# Patient Record
Sex: Female | Born: 1935 | Race: White | Hispanic: No
Health system: Southern US, Community
[De-identification: ages and names within clinical notes are randomized; demographics above are authoritative.]

---

## 2003-11-03 ENCOUNTER — Ambulatory Visit (HOSPITAL_COMMUNITY): Admission: RE | Admit: 2003-11-03 | Discharge: 2003-11-03 | Payer: Self-pay | Admitting: Neurology

## 2005-05-01 ENCOUNTER — Ambulatory Visit: Payer: Self-pay | Admitting: Vascular Surgery

## 2005-05-11 ENCOUNTER — Other Ambulatory Visit: Payer: Self-pay

## 2005-05-11 ENCOUNTER — Ambulatory Visit: Payer: Self-pay | Admitting: Vascular Surgery

## 2005-05-23 ENCOUNTER — Inpatient Hospital Stay: Payer: Self-pay | Admitting: Internal Medicine

## 2005-05-23 ENCOUNTER — Other Ambulatory Visit: Payer: Self-pay

## 2005-06-28 ENCOUNTER — Encounter: Payer: Self-pay | Admitting: Internal Medicine

## 2005-06-29 ENCOUNTER — Ambulatory Visit: Payer: Self-pay | Admitting: Vascular Surgery

## 2007-01-07 ENCOUNTER — Ambulatory Visit: Payer: Self-pay | Admitting: Surgery

## 2007-01-25 ENCOUNTER — Ambulatory Visit: Payer: Self-pay | Admitting: Unknown Physician Specialty

## 2007-07-25 ENCOUNTER — Ambulatory Visit: Payer: Self-pay | Admitting: Unknown Physician Specialty

## 2008-04-01 ENCOUNTER — Ambulatory Visit: Payer: Self-pay | Admitting: Internal Medicine

## 2008-04-02 ENCOUNTER — Ambulatory Visit: Payer: Self-pay | Admitting: Internal Medicine

## 2008-04-06 ENCOUNTER — Inpatient Hospital Stay: Payer: Self-pay | Admitting: Internal Medicine

## 2008-04-06 ENCOUNTER — Other Ambulatory Visit: Payer: Self-pay

## 2008-04-09 ENCOUNTER — Ambulatory Visit: Payer: Self-pay | Admitting: Internal Medicine

## 2008-05-03 ENCOUNTER — Ambulatory Visit: Payer: Self-pay | Admitting: Radiation Oncology

## 2008-05-03 ENCOUNTER — Ambulatory Visit: Payer: Self-pay | Admitting: Internal Medicine

## 2008-05-06 ENCOUNTER — Inpatient Hospital Stay: Payer: Self-pay | Admitting: Internal Medicine

## 2008-06-02 ENCOUNTER — Ambulatory Visit: Payer: Self-pay | Admitting: Radiation Oncology

## 2008-06-02 ENCOUNTER — Ambulatory Visit: Payer: Self-pay | Admitting: Internal Medicine

## 2009-06-18 IMAGING — CR DG CHEST 1V PORT
1 series · 1 of 1 positions shown · non-contrast
Comparison: none

REASON FOR EXAM: Check position of central venous catheter
COMMENTS:

PROCEDURE:     DXR - DXR PORTABLE CHEST SINGLE VIEW  - April 15, 2008  [DATE]
RESULT:     A Port-A-Cath is noted with its tip in the superior vena cava.
Atelectasis is noted in the RIGHT lower lobe. This is a new finding from
04-06-08.  The LEFT lung is clear.

[view not recorded]
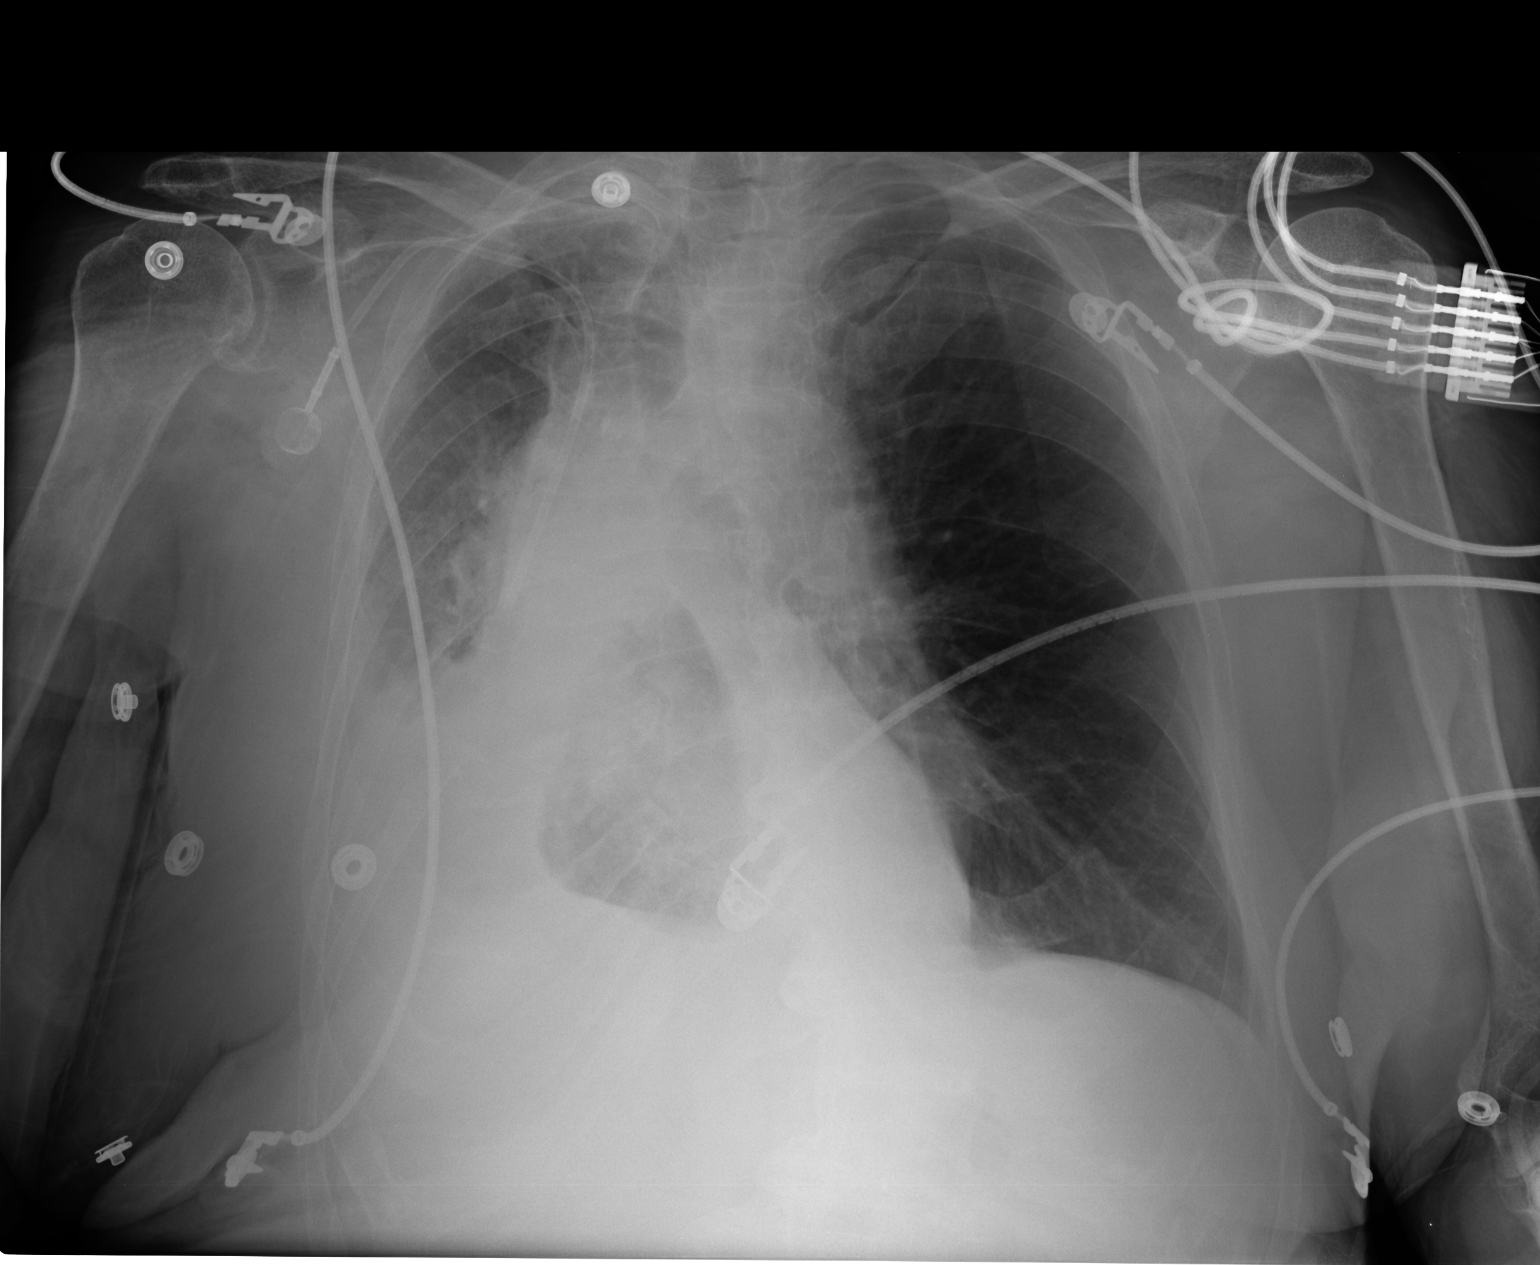

[1 of 1 positions shown; findings below may reference images not displayed]

IMPRESSION: 1. New onset of atelectasis in the RIGHT lower lobe and possible RIGHT
middle lobe. Shift of the midline from LEFT to RIGHT is present.

2. Port-A-Cath noted in good anatomic position. Close follow-up chest x-ray
is recommended to demonstrate clearing of the dense atelectasis on the
RIGHT.

## 2009-07-09 IMAGING — CR DG CHEST 2V
1 series · 2 of 2 positions shown · non-contrast
Comparison: none

REASON FOR EXAM: dyspnea
COMMENTS:

[Series 1: view not recorded · 0.17mm/px · 2 of 2 slices shown]
[im 1/2]
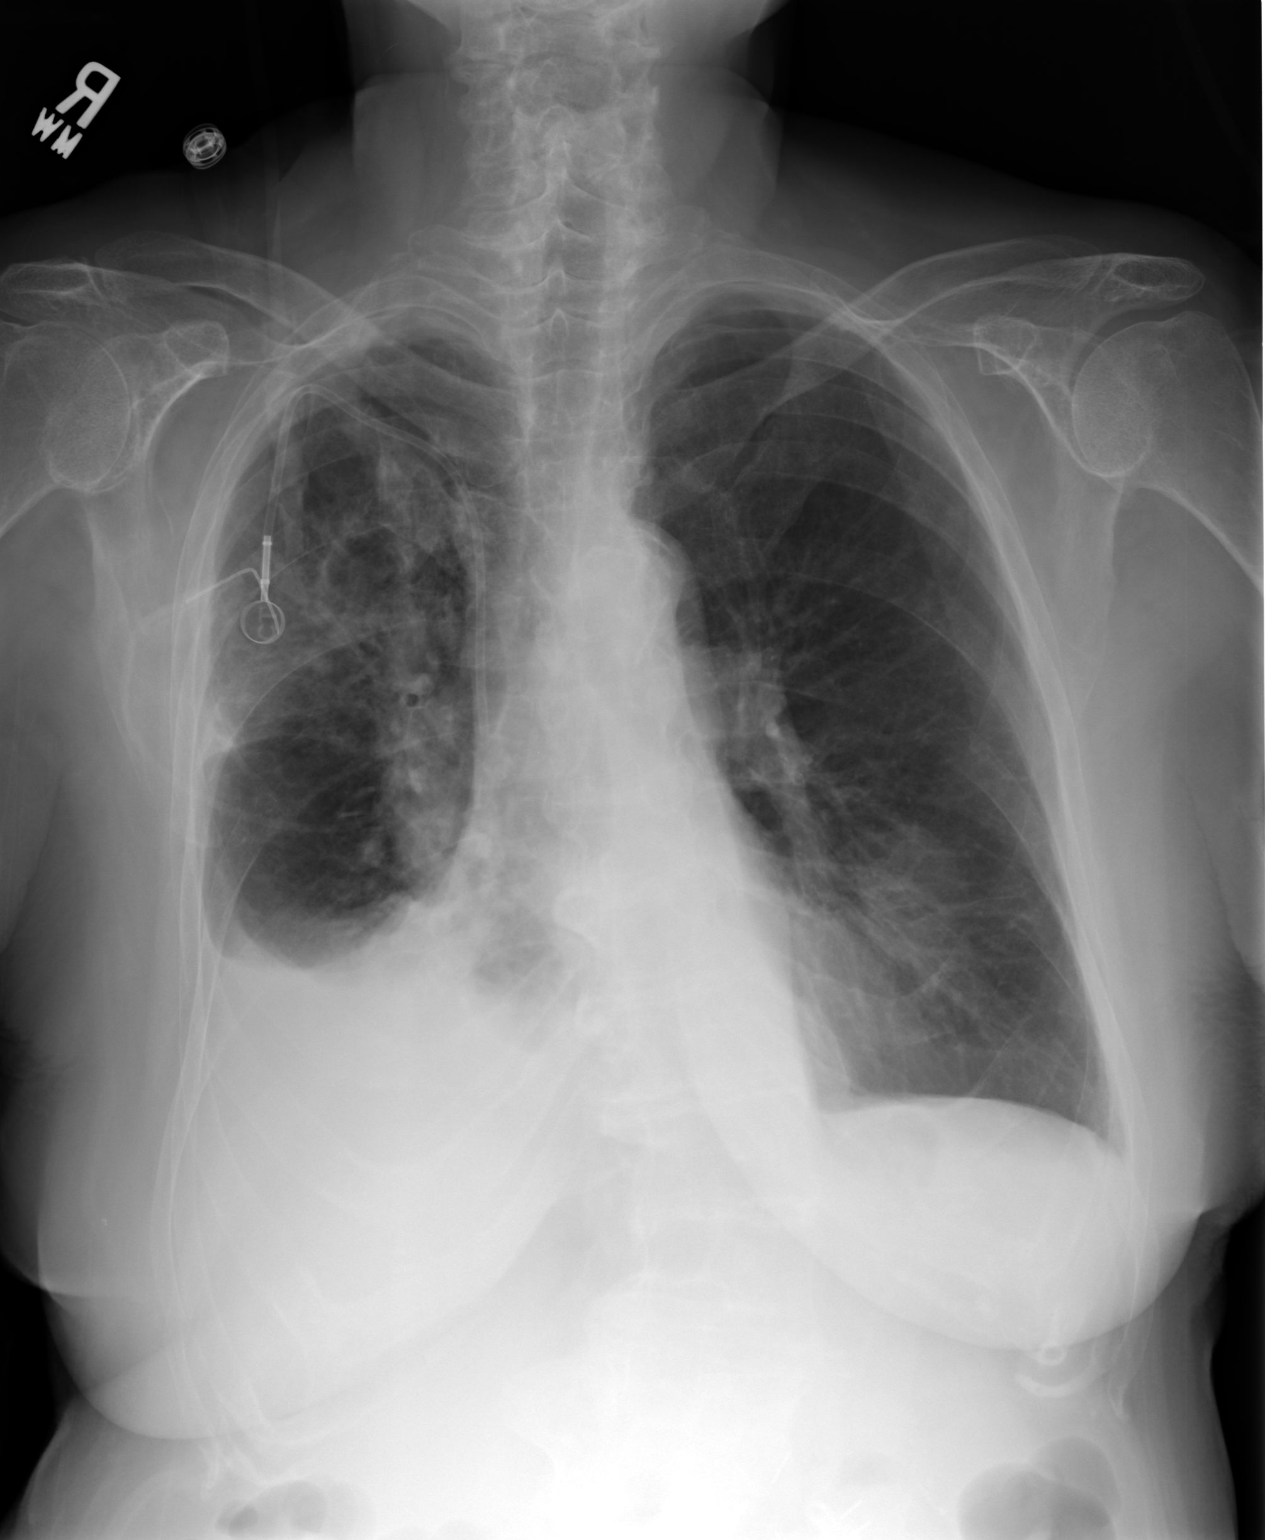
[im 2/2]
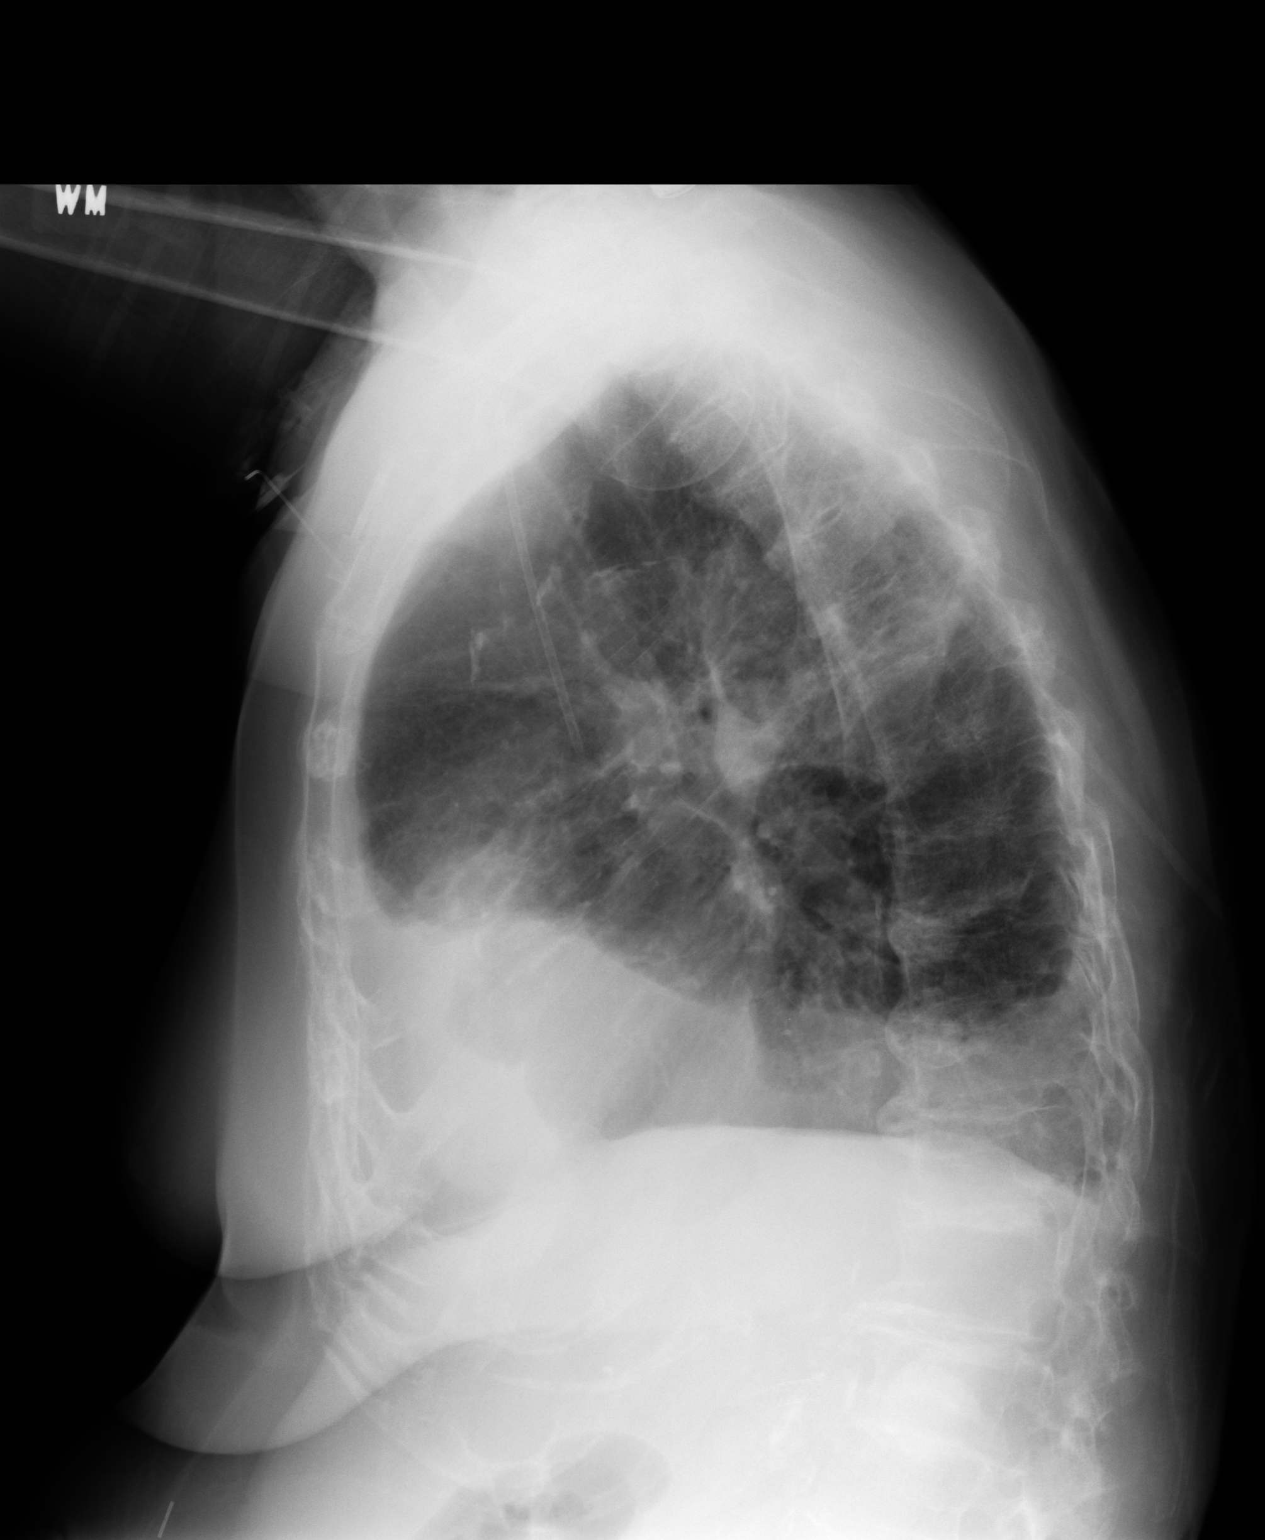

[2 of 2 positions shown; findings below may reference images not displayed]

PROCEDURE:     DXR - DXR CHEST PA (OR AP) AND LATERAL  - May 06, 2008  [DATE]

RESULT:     Comparison is made of the exam dated 04/16/2008 as well as to
the study of 04/20/2008. There is some persistent pleural effusion on the
RIGHT. There is a smaller amount than seen initially but similar to the most
recently mentioned exam. Patchy RIGHT upper lobe infiltrate appears to be
present with some LEFT basilar atelectasis and RIGHT basilar atelectasis
present. There is a RIGHT subclavian central venous catheter present with
the tip in the superior vena cava. There is no pneumothorax.
IMPRESSION: Decreasing size of the RIGHT pleural effusion over time. There appears to be
some RIGHT upper lobe pneumonia which has occurred since the previous exam.

## 2010-07-24 ENCOUNTER — Encounter: Payer: Self-pay | Admitting: Neurology

## 2017-03-19 ENCOUNTER — Telehealth: Payer: Self-pay

## 2017-03-20 NOTE — Telephone Encounter (Signed)
Janie cannot attend 9/17
